# Patient Record
Sex: Male | Born: 2000 | Race: Black or African American | Hispanic: No | Marital: Single | Smoking: Never smoker
Health system: Southern US, Community
[De-identification: ages and names within clinical notes are randomized; demographics above are authoritative.]

## PROBLEM LIST (undated history)

## (undated) DIAGNOSIS — D496 Neoplasm of unspecified behavior of brain: Secondary | ICD-10-CM

## (undated) DIAGNOSIS — G40909 Epilepsy, unspecified, not intractable, without status epilepticus: Secondary | ICD-10-CM

---
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
## 2020-06-18 ENCOUNTER — Ambulatory Visit (INDEPENDENT_AMBULATORY_CARE_PROVIDER_SITE_OTHER): Payer: Medicaid Other | Admitting: Pediatrics

## 2020-08-14 ENCOUNTER — Encounter (INDEPENDENT_AMBULATORY_CARE_PROVIDER_SITE_OTHER): Payer: Self-pay

## 2020-09-05 ENCOUNTER — Emergency Department (HOSPITAL_COMMUNITY)
Admission: EM | Admit: 2020-09-05 | Discharge: 2020-09-05 | Disposition: A | Discharge status: Home / Self Care | Payer: Medicaid Other | Attending: Emergency Medicine | Admitting: Emergency Medicine

## 2020-09-05 ENCOUNTER — Encounter (HOSPITAL_COMMUNITY): Payer: Self-pay | Admitting: *Deleted

## 2020-09-05 ENCOUNTER — Other Ambulatory Visit: Payer: Self-pay

## 2020-09-05 DIAGNOSIS — M6282 Rhabdomyolysis: Secondary | ICD-10-CM | POA: Diagnosis not present

## 2020-09-05 DIAGNOSIS — M79602 Pain in left arm: Secondary | ICD-10-CM | POA: Insufficient documentation

## 2020-09-05 DIAGNOSIS — R748 Abnormal levels of other serum enzymes: Secondary | ICD-10-CM | POA: Diagnosis not present

## 2020-09-05 LAB — CBC WITH DIFFERENTIAL/PLATELET
Abs Immature Granulocytes: 0.01 10*3/uL (ref 0.00–0.07)
Basophils Absolute: 0 10*3/uL (ref 0.0–0.1)
Basophils Relative: 1 %
Eosinophils Absolute: 0.2 10*3/uL (ref 0.0–0.5)
Eosinophils Relative: 3 %
HCT: 49.1 % (ref 39.0–52.0)
Hemoglobin: 16.2 g/dL (ref 13.0–17.0)
Immature Granulocytes: 0 %
Lymphocytes Relative: 36 %
Lymphs Abs: 2.4 10*3/uL (ref 0.7–4.0)
MCH: 28.5 pg (ref 26.0–34.0)
MCHC: 33 g/dL (ref 30.0–36.0)
MCV: 86.4 fL (ref 80.0–100.0)
Monocytes Absolute: 0.4 10*3/uL (ref 0.1–1.0)
Monocytes Relative: 7 %
Neutro Abs: 3.7 10*3/uL (ref 1.7–7.7)
Neutrophils Relative %: 53 %
Platelets: 290 10*3/uL (ref 150–400)
RBC: 5.68 MIL/uL (ref 4.22–5.81)
RDW: 12.2 % (ref 11.5–15.5)
WBC: 6.8 10*3/uL (ref 4.0–10.5)
nRBC: 0 % (ref 0.0–0.2)

## 2020-09-05 LAB — BASIC METABOLIC PANEL
Anion gap: 9 (ref 5–15)
Anion gap: 7 (ref 5–15)
BUN: 9 mg/dL (ref 6–20)
BUN: 10 mg/dL (ref 6–20)
CO2: 25 mmol/L (ref 22–32)
CO2: 26 mmol/L (ref 22–32)
Calcium: 9.3 mg/dL (ref 8.9–10.3)
Calcium: 8.8 mg/dL — ABNORMAL LOW (ref 8.9–10.3)
Chloride: 102 mmol/L (ref 98–111)
Chloride: 103 mmol/L (ref 98–111)
Creatinine, Ser: 0.98 mg/dL (ref 0.61–1.24)
Creatinine, Ser: 0.94 mg/dL (ref 0.61–1.24)
GFR, Estimated: >60 mL/min (ref >60)
GFR, Estimated: >60 mL/min (ref >60)
Glucose, Bld: 88 mg/dL (ref 70–99)
Glucose, Bld: 79 mg/dL (ref 70–99)
Potassium: 3.7 mmol/L (ref 3.5–5.1)
Potassium: 3.6 mmol/L (ref 3.5–5.1)
Sodium: 136 mmol/L (ref 135–145)
Sodium: 136 mmol/L (ref 135–145)

## 2020-09-05 LAB — URINALYSIS, ROUTINE W REFLEX MICROSCOPIC
Bilirubin Urine: NEGATIVE
Glucose, UA: NEGATIVE mg/dL
Hgb urine dipstick: NEGATIVE
Ketones, ur: NEGATIVE mg/dL
Leukocytes,Ua: NEGATIVE
Nitrite: NEGATIVE
Protein, ur: NEGATIVE mg/dL
Specific Gravity, Urine: 1.023 (ref 1.005–1.030)
pH: 5 (ref 5.0–8.0)

## 2020-09-05 LAB — CK
Total CK: 5357 U/L — ABNORMAL HIGH (ref 49–397)
Total CK: 3887 U/L — ABNORMAL HIGH (ref 49–397)

## 2020-09-05 MED ORDER — LACTATED RINGERS IV BOLUS
1000.0000 mL | Freq: Once | INTRAVENOUS | Status: AC
  Administered 2020-09-05: 1000 mL via INTRAVENOUS

## 2020-09-05 NOTE — ED Provider Notes (Signed)
Emergency Medicine Provider Triage Evaluation Note  Dale [facility] , a 20 y.o. male  was evaluated in triage.  Pt complains of lateral arm pain, left greater than right.  States he was working out just his biceps yesterday.  He feels like he "overdid it."  He states has been trying to build his upper body for quite some time and typically lifts weights however today feels different.  He has diffuse aching to his bilateral biceps.  No overlying redness, swelling or warmth.  No paresthesias or weakness.  He denies any recent traumatic injury.  States he has not urinated today.    Review of Systems  Positive: Bilateral arm pain, Anuria  Negative: Paresthesias, weakness  Physical Exam  There were no vitals taken for this visit. Gen:   Awake, no distress   Resp:  Normal effort  MSK:   Moves extremities without difficulty.  Diffuse tenderness to bilateral biceps.  Full range of motion at olecranon, wrist and shoulders.  Equal grip bilaterally Neuro:  Intact sensation  Other:    Medical Decision Making  Medically screening exam initiated at 4:07 PM.  Appropriate orders placed.  [name] was informed that the remainder of the evaluation will be completed by another provider, this initial triage assessment does not replace that evaluation, and the importance of remaining in the ED until their evaluation is complete.  Bilateral arm pain after working out.  He is not urinated today.  Concern for rhabdomyolysis.  Basic labs, CK level obtained.   No obvious VTE on exam.  Stable   [name], PA-C 09/05/20 1611    Breck Coons, MD 09/05/20 [phone]

## 2020-09-05 NOTE — ED Triage Notes (Signed)
The pt has been working out for 2 weeks working out each day in different part of his body.  Painful lt upper arm since yesterday

## 2020-09-05 NOTE — ED Provider Notes (Signed)
[facility]GENC[facility] DEPARTMENT Provider Note   CSN: [id number] Arrival date & time: 09/05/20  1556     History Chief Complaint  Patient presents with  . Arm Pain    Sears Bumgarner is a 20 y.o. male.   Arm Pain This is a new problem. The current episode started yesterday. The problem occurs constantly. The problem has not changed since onset.Pertinent negatives include no chest pain, no abdominal pain, no headaches and no shortness of breath. Nothing aggravates the symptoms. Nothing relieves the symptoms. He has tried nothing for the symptoms. The treatment provided no relief.       Past Medical History:  Diagnosis Date  . Brain tumor ([place])   . Seizure disorder [facility])     Patient Active Problem List   Diagnosis Date Noted  . Ganglioglioma of brain ([place]) 12/26/2019  . Quadrantanopsia, left 12/19/2019  . History of brain tumor 08/02/2019  . Primary brain tumor ([place]) 06/01/2018  . Focal epilepsy with impairment of consciousness ([place]) 06/01/2018  . Transient alteration of awareness 03/14/2017  . Abnormal involuntary movements 03/14/2017    Past Surgical History:  Procedure Laterality Date  . CRANIOTOMY FOR TUMOR Left 06/18/2019   Grade 1 ganglioglioma       No family history on file.  Social History   Tobacco Use  . Smoking status: Never Smoker  . Smokeless tobacco: Never Used  Substance Use Topics  . Alcohol use: Never  . Drug use: Never    Home Medications Prior to Admission medications   Medication Sig Start Date End Date Taking? Authorizing Provider  levETIRAcetam (KEPPRA) 500 MG tablet Take 1 1/2 tablets PO  BID 12/19/19   Jodi Geralds, MD    Allergies    Patient has no known allergies.  Review of Systems   Review of Systems  Constitutional: Negative for chills and fever.  HENT: Negative for congestion and rhinorrhea.   Respiratory: Negative for cough and shortness of breath.   Cardiovascular: Negative for chest pain and  palpitations.  Gastrointestinal: Negative for abdominal pain, diarrhea, nausea and vomiting.  Genitourinary: Negative for difficulty urinating, dysuria and hematuria.  Musculoskeletal: Positive for myalgias. Negative for arthralgias and back pain.  Skin: Negative for color change and rash.  Neurological: Negative for light-headedness and headaches.    Physical Exam Updated Vital Signs BP 120/88   Pulse 60   Temp 98.6 F (37 C) (Oral)   Resp 14   Ht 5\' 11"  (1.803 m)   Wt 68 kg   SpO2 99%   BMI 20.92 kg/m   Physical Exam Vitals and nursing note reviewed.  Constitutional:      General: He is not in acute distress.    Appearance: Normal appearance.  HENT:     Head: Normocephalic and atraumatic.     Nose: No rhinorrhea.  Eyes:     General:        Right eye: No discharge.        Left eye: No discharge.     Conjunctiva/sclera: Conjunctivae normal.  Cardiovascular:     Rate and Rhythm: Normal rate and regular rhythm.  Pulmonary:     Effort: Pulmonary effort is normal.     Breath sounds: No stridor.  Abdominal:     General: Abdomen is flat. There is no distension.     Palpations: Abdomen is soft.  Musculoskeletal:        General: Tenderness present. No deformity or signs of injury.  Comments: Tenderness of bilateral biceps belly muscles.  Normal range of motion neurovascular intact no bony tenderness.  No color change no deformity  Skin:    General: Skin is warm and dry.  Neurological:     General: No focal deficit present.     Mental Status: He is alert. Mental status is at baseline.     Motor: No weakness.  Psychiatric:        Mood and Affect: Mood normal.        Behavior: Behavior normal.        Thought Content: Thought content normal.     ED Results / Procedures / Treatments   Labs (all labs ordered are listed, but only abnormal results are displayed) Labs Reviewed  CK - Abnormal; Notable for the following components:      Result Value   Total CK 5,357  (*)    All other components within normal limits  CK - Abnormal; Notable for the following components:   Total CK 3,887 (*)    All other components within normal limits  BASIC METABOLIC PANEL - Abnormal; Notable for the following components:   Calcium 8.8 (*)    All other components within normal limits  CBC WITH DIFFERENTIAL/PLATELET  BASIC METABOLIC PANEL  URINALYSIS, ROUTINE W REFLEX MICROSCOPIC    EKG None  Radiology No results found.  Procedures Procedures   Medications Ordered in ED Medications  lactated ringers bolus 1,000 mL (0 mLs Intravenous Stopped 09/05/20 2126)  lactated ringers bolus 1,000 mL (0 mLs Intravenous Stopped 09/05/20 2126)  lactated ringers bolus 1,000 mL (1,000 mLs Intravenous New Bag/Given 09/05/20 2126)    ED Course  I have reviewed the triage vital signs and the nursing notes.  Pertinent labs & imaging results that were available during my care of the patient were reviewed by me and considered in my medical decision making (see chart for details).    MDM Rules/Calculators/A&P                         Elevated CK, likely rhabdomyolysis from overuse at the gym.  Overall well-appearing normal kidney function after review of labs.  Will give significant fluid hydration we will recheck labs.  Patient is given 2 L of fluids and labs are rechecked.  CK is downtrending.  Repeat chemistry is unchanged.  Patient likely has a rhabdomyolysis from overexertion.  He is instructed to hydrate well follow-up for repeat testing.  Pt is safe for DC home with outpatient follow up. The patient agrees with the plan and has no other questions or concerns.   Final Clinical Impression(s) / ED Diagnoses Final diagnoses:  Elevated CK  Non-traumatic rhabdomyolysis    Rx / DC Orders ED Discharge Orders    None       Breck Coons, MD 09/05/20 2236

## 2020-09-05 NOTE — Discharge Instructions (Addendum)
Continue to hydrate well.  When she drinking least 100 ounces of fluids per day over the next 2 days.  Follow-up with your primary care doctor if symptoms or not improving or come back and see [place].  See your primary care doctor early next week for repeat laboratory testing.  Come back to [place] with changes in your ability to urinate or the pain.

## 2020-09-28 ENCOUNTER — Emergency Department (HOSPITAL_COMMUNITY)
Admission: EM | Admit: 2020-09-28 | Discharge: 2020-09-28 | Disposition: A | Discharge status: Home / Self Care | Payer: Medicaid Other | Attending: Emergency Medicine | Admitting: Emergency Medicine

## 2020-09-28 ENCOUNTER — Encounter (HOSPITAL_COMMUNITY): Payer: Self-pay | Admitting: Emergency Medicine

## 2020-09-28 ENCOUNTER — Other Ambulatory Visit: Payer: Self-pay

## 2020-09-28 DIAGNOSIS — M533 Sacrococcygeal disorders, not elsewhere classified: Secondary | ICD-10-CM | POA: Diagnosis present

## 2020-09-28 DIAGNOSIS — M7918 Myalgia, other site: Secondary | ICD-10-CM

## 2020-09-28 MED ORDER — AMOXICILLIN-POT CLAVULANATE 875-125 MG PO TABS: 1.0000 | ORAL_TABLET | Freq: Two times a day (BID) | ORAL | 0 refills | Status: AC

## 2020-09-28 NOTE — Discharge Instructions (Addendum)
-  Prescription sent to the pharmacy for Augmentin.  This is an antibiotic.  Take as prescribed.   Follow-up with your primary care doctor.  Their contact information is listed in your discharge paperwork.  Call the office to schedule an appointment.   You can take Tylenol and ibuprofen for pain.  You can also try applying a warm compress to the area to see if that helps.   Return to ER for new or worsening symptoms.

## 2020-09-28 NOTE — ED Triage Notes (Signed)
Pt here for tailbone pain  x4 days, pt denies injury/fall to the area. Pt reports feeling a small bump towards L buttocks.

## 2020-09-28 NOTE — ED Provider Notes (Signed)
Emergency Medicine Provider Triage Evaluation Note  D[facility] , a 20 y.o. male  was evaluated in triage.  Pt complains of tailbone pain x 4 days. Denies injury. Feels a lump when he touches the area.  Review of Systems  Positive: Back pain Negative: Fever, chills, numbness, tingling, extremity weakness  Physical Exam  BP 132/84 (BP Location: Right Arm)   Pulse 61   Temp 98.7 F (37.1 C) (Oral)   Resp 16   SpO2 99%  Gen:   Awake, no distress   Resp:  Normal effort  MSK:   Moves extremities without difficulty  Other:  Ambulatory with normal gait  Medical Decision Making  Medically screening exam initiated at 7:54 AM.  Appropriate orders placed.  [facility] was informed that the remainder of the evaluation will be completed by another provider, this initial triage assessment does not replace that evaluation, and the importance of remaining in the ED until their evaluation is complete.  ?possible abscess vs pilonidal cyst. Will need full exam once in treatment room.    Portions of this note were generated with Lobbyist. Dictation errors may occur despite best attempts at proofreading.    Barrie Folk, PA-C 09/28/20 1314    Noemi Chapel, MD 10/01/20 1038

## 2020-09-28 NOTE — ED Provider Notes (Signed)
[place] EMERGENCY DEPARTMENT Provider Note   CSN: [id number] Arrival date & time: 09/28/20  3419     History Chief Complaint  Patient presents with   Tailbone Pain    Dale Hart is a 20 y.o. male with past medical history significant for ganglioma of brain, seizure disorder.  HPI Patient presents to emergency room today with chief complaint of tailbone pain x4 days.  He denies any known injury.  He states he typically is an active person playing lots of sports however has not played sports in over 3 weeks.  He is endorsing dull aching pain located at the base of his tailbone.  He states the pain is constant.  He rates the pain 4 out of 10 in severity.  Patient states he feels a small bump between his buttocks.  He has not taken any over-the-counter medications for symptoms prior to arrival.  He denies any fever, chills, abdominal pain, pain with defecation, extremity weakness, weight loss, night sweats.    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 No pertinent family history.  Social History   Tobacco Use   Smoking status: Never   Smokeless tobacco: Never  Substance Use Topics   Alcohol use: Never   Drug use: Never    Home Medications Prior to Admission medications   Medication Sig Start Date End Date Taking? Authorizing Provider  amoxicillin-clavulanate (AUGMENTIN) 875-125 MG tablet Take 1  tablet by mouth every 12 (twelve) hours. 09/28/20  Yes Barrie Folk, PA-C  levETIRAcetam (KEPPRA) 500 MG tablet Take 1 1/2 tablets PO  BID 12/19/19   Jodi Geralds, MD    Allergies    Patient has no known allergies.  Review of Systems   Review of Systems All other systems are reviewed and are negative for acute change except as noted in the HPI.  Physical Exam Updated Vital Signs BP 132/84 (BP Location: Right Arm)   Pulse 61   Temp 98.7 F (37.1 C) (Oral)   Resp 16   Ht 5\' 11"  (1.803 m)   Wt 68 kg   SpO2 99%   BMI 20.92 kg/m   Physical Exam Vitals and nursing note reviewed.  Constitutional:      Appearance: He is well-developed. He is not ill-appearing or toxic-appearing.  HENT:     Head: Normocephalic and atraumatic.     Nose: Nose normal.  Eyes:     General: No scleral icterus.       Right eye: No discharge.        Left eye: No discharge.     Conjunctiva/sclera: Conjunctivae normal.  Neck:     Vascular: No JVD.  Cardiovascular:     Rate and Rhythm: Normal rate and regular rhythm.     Pulses: Normal pulses.     Heart sounds: Normal heart sounds.  Pulmonary:     Effort: Pulmonary effort is normal.  Breath sounds: Normal breath sounds.  Abdominal:     General: There is no distension.  Musculoskeletal:        General: Normal range of motion.     Cervical back: Normal range of motion.  Skin:    General: Skin is warm and dry.     Comments: Lysbeth Galas RN present for exam. No abscess appreciated in gluteal cleft. With deep palpation of left gluteal cleft there is approximately 1x1 cm area of mobile induration. No overlying skin changes.  Neurological:     Mental Status: He is oriented to person, place, and time.     GCS: GCS eye subscore is 4. GCS verbal subscore is 5. GCS motor subscore is 6.     Comments: Fluent speech, no facial droop.  Psychiatric:        Behavior: Behavior normal.    ED Results / Procedures / Treatments   Labs (all labs  ordered are listed, but only abnormal results are displayed) Labs Reviewed - No data to display  EKG None  Radiology No results found.  Procedures Procedures   Medications Ordered in ED Medications - No data to display  ED Course  I have reviewed the triage vital signs and the nursing notes.  Pertinent labs & imaging results that were available during my care of the patient were reviewed by me and considered in my medical decision making (see chart for details).    MDM Rules/Calculators/A&P                          History provided by patient with additional history obtained from chart review.    Patient is well-appearing, nontoxic.  Patient with likely early pilonidal cyst.  No overlying skin changes, no signs of infection.  Not amendable to I&D at this time based on how deep it is.  Will cover with antibiotics and have patient follow-up with PCP for recheck.  Strict return precautions discussed.    Portions of this note were generated with [employer]. Dictation errors may occur despite best attempts at proofreading.  Final Clinical Impression(s) / ED Diagnoses Final diagnoses:  Buttock pain    Rx / DC Orders ED Discharge Orders          Ordered    amoxicillin-clavulanate (AUGMENTIN) 875-125 MG tablet  Every 12 hours        09/28/20 0843             Barrie Folk, PA-C 09/28/20 [id number]    Noemi Chapel, MD 10/01/20 1038

## 2020-11-07 ENCOUNTER — Other Ambulatory Visit: Payer: Self-pay

## 2020-11-07 ENCOUNTER — Emergency Department (HOSPITAL_COMMUNITY)
Admission: EM | Admit: 2020-11-07 | Discharge: 2020-11-08 | Disposition: A | Discharge status: Home / Self Care | Payer: Medicaid Other | Attending: Emergency Medicine | Admitting: Emergency Medicine

## 2020-11-07 DIAGNOSIS — S80912A Unspecified superficial injury of left knee, initial encounter: Secondary | ICD-10-CM | POA: Diagnosis not present

## 2020-11-07 DIAGNOSIS — Y92310 Basketball court as the place of occurrence of the external cause: Secondary | ICD-10-CM | POA: Insufficient documentation

## 2020-11-07 DIAGNOSIS — Y9367 Activity, basketball: Secondary | ICD-10-CM | POA: Diagnosis not present

## 2020-11-07 DIAGNOSIS — X58XXXA Exposure to other specified factors, initial encounter: Secondary | ICD-10-CM | POA: Insufficient documentation

## 2020-11-07 DIAGNOSIS — M25562 Pain in left knee: Secondary | ICD-10-CM

## 2020-11-07 NOTE — ED Provider Notes (Signed)
Emergency Medicine Provider Triage Evaluation Note  Dale Hart , a 20 y.o. male  was evaluated in triage.  Pt complains of left knee pain after playing bball today. Hyperextended his knee.  Pain is worsened with movement.  He states that it feels slightly unstable.  Denies any treatment prior to arrival..  Review of Systems  Positive: Arthralgia, myalgia Negative: Fever, chills  Physical Exam  BP 120/72 (BP Location: Left Arm)   Pulse 81   Temp 99.2 F (37.3 C) (Oral)   Resp 18   Ht '5\' 11"'$  (1.803 m)   Wt 68 kg   SpO2 100%   BMI 20.92 kg/m  Gen:   Awake, no distress  Resp:  Normal effort MSK:   Left knee range of motion limited secondary to pain Other:    Medical Decision Making  Medically screening exam initiated at 10:50 PM.  Appropriate orders placed.  Holdan Sandin was informed that the remainder of the evaluation will be completed by another provider, this initial triage assessment does not replace that evaluation, and the importance of remaining in the ED until their evaluation is complete.  X-ray of left knee Suspect knee sprain, concern for internal derangement, will give immobilizer and crutches.  Will need Ortho follow-up.  Left knee pain.   Montine Circle, PA-C 11/07/20 2252    Malvin Johns, MD 11/07/20 [phone]

## 2020-11-07 NOTE — ED Triage Notes (Signed)
Pt states sustained left knee injury today while playing basketball. Ambulatory in triage.

## 2020-11-08 ENCOUNTER — Emergency Department (HOSPITAL_COMMUNITY): Payer: Medicaid Other

## 2020-11-08 MED ORDER — IBUPROFEN 800 MG PO TABS: 800.0000 mg | ORAL_TABLET | Freq: Three times a day (TID) | ORAL | 0 refills | Status: AC

## 2020-11-08 NOTE — ED Provider Notes (Signed)
[place] EMERGENCY DEPARTMENT Provider Note   CSN: [id number] Arrival date & time: 11/07/20  2234     History Chief Complaint  Patient presents with   Knee Injury    Dale Hart is a 20 y.o. male.  Patient presents emergency department with a chief complaint of left knee pain.  He states he injured his left knee while playing basketball today.  It hyperextended.  He reports feeling unstable.  Has worsening pain with flexion.  Has not taken anything for the symptoms.  He is able to ambulate on it, though it is painful.  The history is provided by the patient. No language interpreter was used.      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  Social History   Tobacco Use   Smoking status: Never   Smokeless tobacco: Never  Substance Use Topics   Alcohol use: Never   Drug use: Never    Home Medications Prior to Admission medications   Medication Sig Start Date End Date Taking? Authorizing Provider  ibuprofen (ADVIL) 800 MG tablet Take 1 tablet (800 mg total) by mouth 3 (three) times daily. 11/08/20  Yes Montine Circle, PA-C  amoxicillin-clavulanate (AUGMENTIN) 875-125 MG tablet Take 1 tablet by mouth every 12 (twelve) hours. 09/28/20   Barrie Folk, PA-C  levETIRAcetam (KEPPRA) 500 MG tablet Take 1 1/2 tablets PO  BID 12/19/19   Jodi Geralds, MD    Allergies    Patient has  no known allergies.  Review of Systems   Review of Systems  All other systems reviewed and are negative.  Physical Exam Updated Vital Signs BP 120/72 (BP Location: Left Arm)   Pulse 81   Temp 99.2 F (37.3 C) (Oral)   Resp 18   Ht '5\' 11"'$  (1.803 m)   Wt 68 kg   SpO2 100%   BMI 20.92 kg/m   Physical Exam Nursing note and vitals reviewed.  Constitutional: Pt appears well-developed and well-nourished. No distress.  HENT:  Head: Normocephalic and atraumatic.  Eyes: Conjunctivae are normal.  Neck: Normal range of motion.  Cardiovascular: Normal rate, regular rhythm. Intact distal pulses.   Capillary refill < 3 sec.  Pulmonary/Chest: Effort normal and breath sounds normal.  Musculoskeletal:   Pt exhibits no bony abnormality or deformity about the left knee.   Range of motion and strength slightly limited secondary to pain, joint stability testing deferred due to pain  Neurological: Pt  is alert. Coordination normal.  Sensation: 5/5 Skin: Skin is warm and dry. Pt is not diaphoretic.  No evidence of open wound or skin tenting Psychiatric: Pt has a normal mood and affect.   ED Results / Procedures / Treatments   Labs (all labs ordered are listed, but only abnormal results are displayed) Labs Reviewed - No data to display  EKG None  Radiology DG Knee Complete 4 Views  Left  Result Date: 11/08/2020 CLINICAL DATA:  Left knee pain EXAM: LEFT KNEE - COMPLETE 4+ VIEW COMPARISON:  None. FINDINGS: No evidence of fracture, dislocation, or joint effusion. No evidence of arthropathy or other focal bone abnormality. Soft tissues are unremarkable. IMPRESSION: Negative. Electronically Signed   By: Fidela Salisbury MD   On: 11/08/2020 00:27    Procedures Procedures   Medications Ordered in ED Medications - No data to display  ED Course  I have reviewed the triage vital signs and the nursing notes.  Pertinent labs & imaging results that were available during my care of the patient  were reviewed by me and considered in my medical decision making (see chart for details).    MDM Rules/Calculators/A&P                          Patient presents with injury to left knee.  DDx includes, fracture, strain, or sprain.  Consultants: none  Plain films reveal no fracture or dislocation.  Pt advised to follow up with PCP and/or orthopedics. Patient given crutches and knee immobilizer while in ED, conservative therapy such as RICE recommended and discussed.   Patient will be discharged home & is agreeable with above plan. Returns precautions discussed. Pt appears safe for discharge.  Final Clinical Impression(s) / ED Diagnoses Final diagnoses:  Acute pain of left knee    Rx / DC Orders ED Discharge Orders          Ordered    ibuprofen (ADVIL) 800 MG tablet  3 times daily        11/08/20 0219             Montine Circle, PA-C 11/08/20 [place] Charon, April, MD 11/08/20 0230

## 2021-09-22 IMAGING — CR DG KNEE COMPLETE 4+V*L*
4 series · 4 of 4 positions shown · non-contrast
Comparison: None.

CLINICAL DATA: Left knee pain

EXAM:
LEFT KNEE - COMPLETE 4+ VIEW

[knee ap]
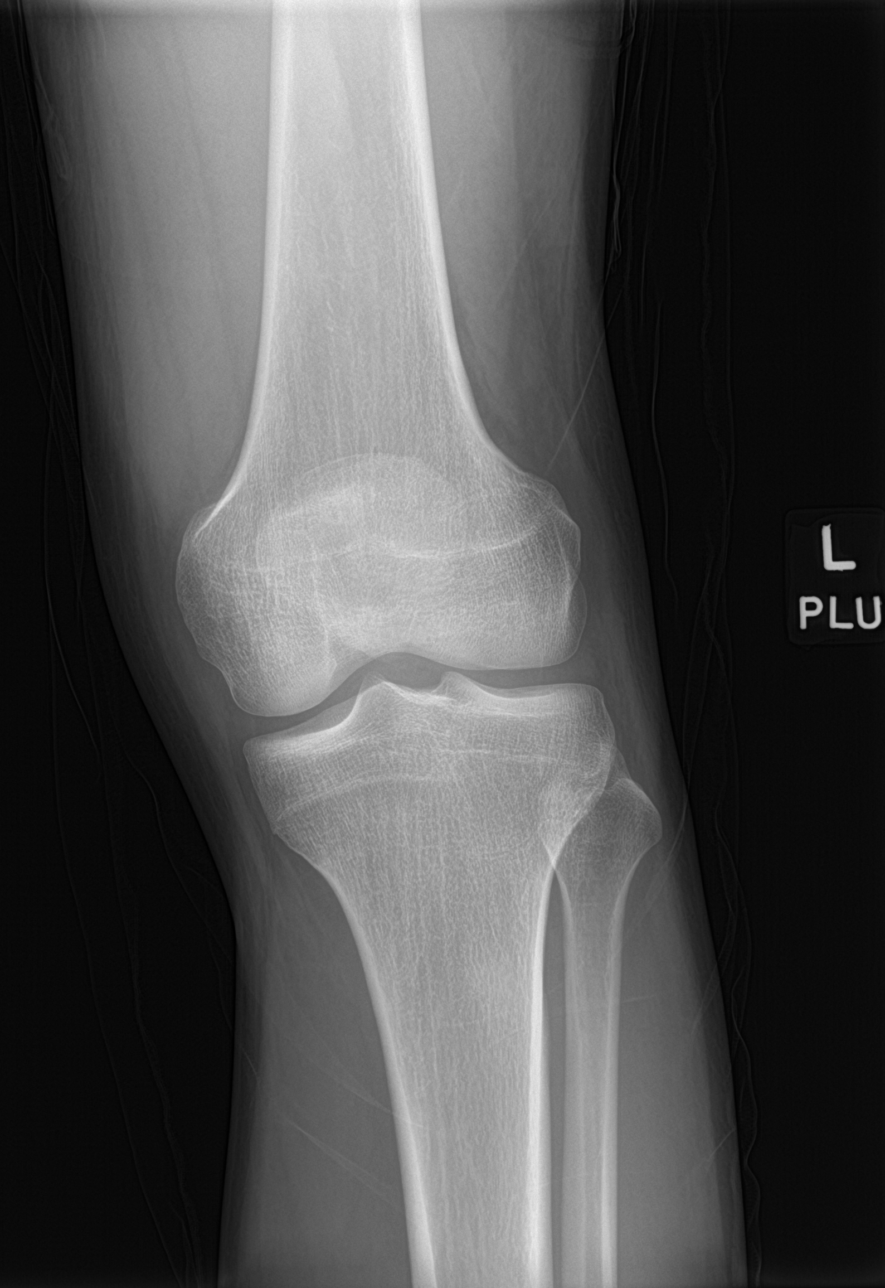

[knee lat]
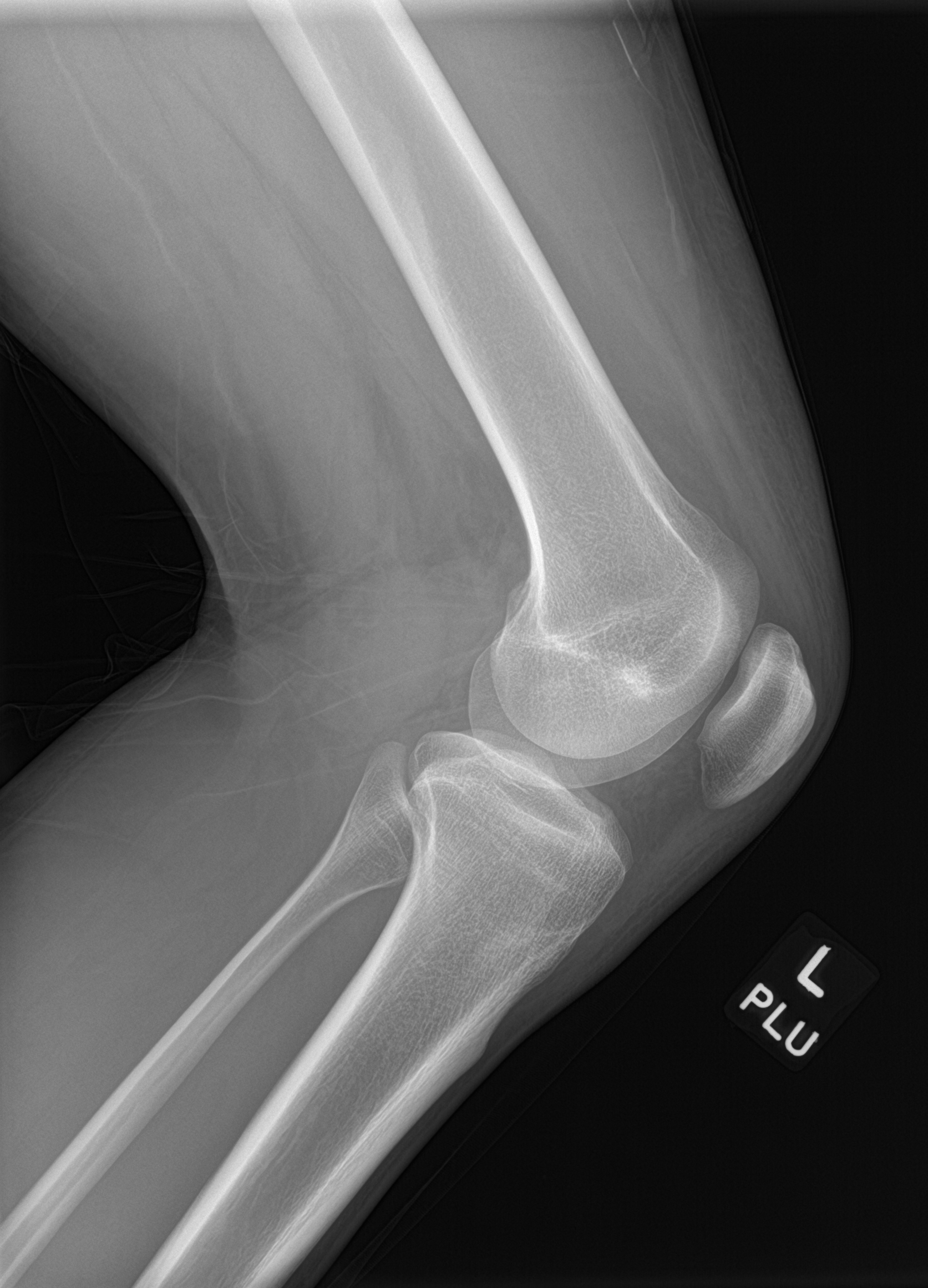

[knee obl (1 of 2)]
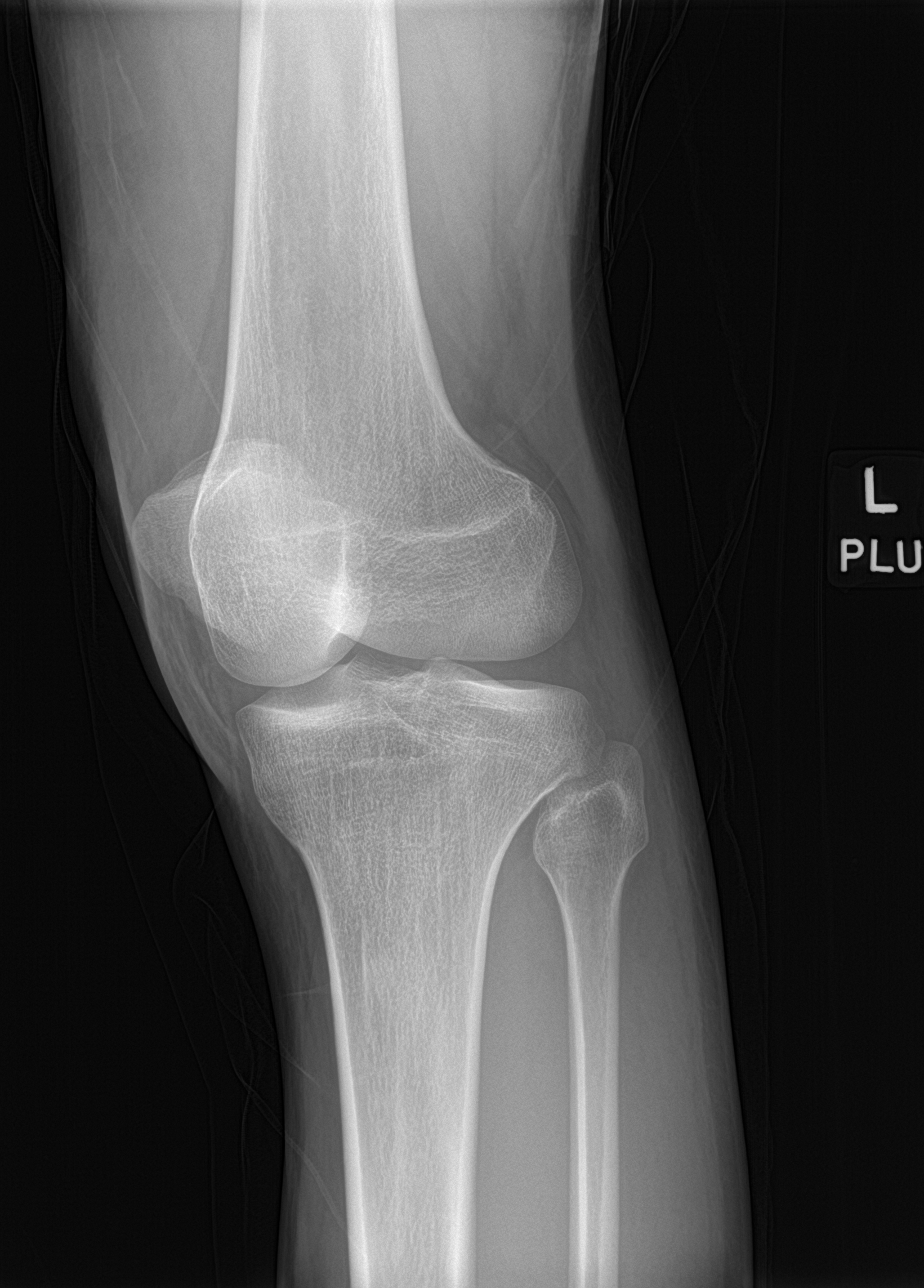

[knee obl (2 of 2)]
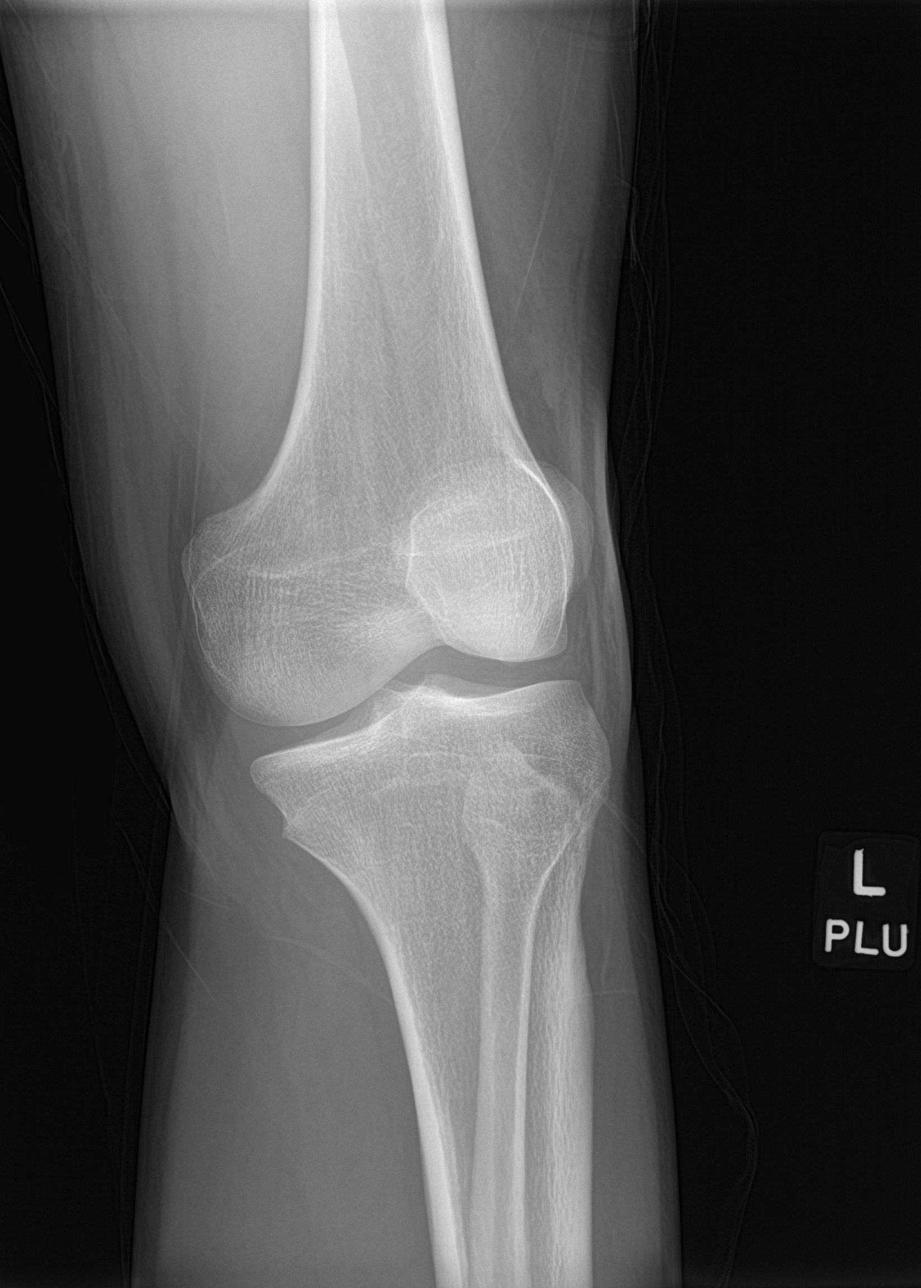

[4 of 4 positions shown; findings below may reference images not displayed]

FINDINGS: No evidence of fracture, dislocation, or joint effusion. No evidence
of arthropathy or other focal bone abnormality. Soft tissues are
unremarkable.
IMPRESSION: Negative.

## 2023-04-14 ENCOUNTER — Encounter (HOSPITAL_COMMUNITY): Payer: Self-pay | Admitting: Emergency Medicine

## 2023-04-14 ENCOUNTER — Emergency Department (HOSPITAL_COMMUNITY)
Admission: EM | Admit: 2023-04-14 | Discharge: 2023-04-14 | Disposition: A | Discharge status: Home / Self Care | Payer: Medicaid Other | Attending: Emergency Medicine | Admitting: Emergency Medicine

## 2023-04-14 ENCOUNTER — Other Ambulatory Visit: Payer: Self-pay

## 2023-04-14 DIAGNOSIS — Z20822 Contact with and (suspected) exposure to covid-19: Secondary | ICD-10-CM | POA: Diagnosis not present

## 2023-04-14 DIAGNOSIS — H1033 Unspecified acute conjunctivitis, bilateral: Secondary | ICD-10-CM | POA: Diagnosis not present

## 2023-04-14 DIAGNOSIS — J029 Acute pharyngitis, unspecified: Secondary | ICD-10-CM

## 2023-04-14 DIAGNOSIS — J069 Acute upper respiratory infection, unspecified: Secondary | ICD-10-CM | POA: Insufficient documentation

## 2023-04-14 LAB — RESP PANEL BY RT-PCR (RSV, FLU A&B, COVID)  RVPGX2
Influenza A by PCR: NEGATIVE
Influenza B by PCR: NEGATIVE
Resp Syncytial Virus by PCR: NEGATIVE
SARS Coronavirus 2 by RT PCR: NEGATIVE

## 2023-04-14 LAB — GROUP A STREP BY PCR: Group A Strep by PCR: NOT DETECTED

## 2023-04-14 MED ORDER — POLYMYXIN B-TRIMETHOPRIM 10000-0.1 UNIT/ML-% OP SOLN: 1.0000 [drp] | Freq: Four times a day (QID) | OPHTHALMIC | 0 refills | Status: AC

## 2023-04-14 MED ORDER — LIDOCAINE VISCOUS HCL 2 % MT SOLN: 15.0000 mL | OROMUCOSAL | 0 refills | Status: AC | PRN

## 2023-04-14 NOTE — ED Triage Notes (Signed)
 Pt from home, reports sore throat, headache and fatigue X4 days.  OTC medication taken once, no relief.

## 2023-04-14 NOTE — Discharge Instructions (Addendum)
 You appear to have an upper respiratory infection (URI). An upper respiratory tract infection, or cold, is a viral infection of the air passages leading to the lungs. It should improve gradually after 5-7 days. You may have a lingering cough that lasts for 2- 4 weeks after the infection.  Your flu, covid, and RSV test were negative today.  Your strep throat is negative.  There are no medications, such as antibiotics, that will cure your infection.   You may have a bacterial infection in your eyes.  You have been prescribed Polytrim  antibiotic drops for your eyes.  Please use these as prescribed.  You have been prescribed viscous lidocaine  to use as needed for your sore throat.  Please take this as prescribed.  Home care instructions:  You can take Tylenol  and/or Ibuprofen  as directed on the packaging for fever reduction and pain relief.    For cough: honey 1/2 to 1 teaspoon (you can dilute the honey in water or another fluid).  You can also use guaifenesin and dextromethorphan for cough which are over-the-counter medications. You can use a humidifier for chest congestion and cough.  If you don't have a humidifier, you can sit in the bathroom with the hot shower running.      For sore throat: try warm salt water gargles, cepacol lozenges, throat spray, warm tea or water with lemon/honey, popsicles or ice, or OTC cold relief medicine for throat discomfort.    For congestion: Flonase 1-2 sprays in each nostril daily.    It is important to stay hydrated: drink plenty of fluids (water, gatorade/powerade/pedialyte, juices, or teas) to keep your throat moisturized and help further relieve irritation/discomfort.   Your illness is contagious and can be spread to others, especially during the first 3 or 4 days. It cannot be cured by antibiotics or other medicines. Take basic precautions such as washing your hands often, covering your mouth when you cough or sneeze, and avoiding public places where you  could spread your illness to others.   Follow-up instructions: Please follow-up with your primary care provider for further evaluation of your symptoms if you are not feeling better within the next 5 days.   Return instructions:  Please return to the Emergency Department if you experience worsening symptoms.  RETURN IMMEDIATELY IF you develop shortness of breath, confusion or altered mental status, a new rash, become dizzy, faint, or poorly responsive, or are unable to be cared for at home. Please return if you have persistent vomiting and cannot keep down fluids or develop a fever that is not controlled by tylenol  or motrin .   Please return if you have any other emergent concerns.

## 2023-04-14 NOTE — ED Provider Notes (Signed)
 [place] [facility]GENCY DEPARTMENT AT [facility] Provider Note   CSN: [id number] Arrival date & time: 04/14/23  0405     History  Chief Complaint  Patient presents with   Sore Throat   Fatigue    Dale Hart is a 23 y.o. male with no significant past medical history presents with concern for sore throat, headache, and general fatigue for the past 4 days.  Also reports subjective fever and chills and nasal congestion.  Denies cough, nausea, vomiting, abdominal pain.  Tried NyQuil once at home which he reports helped and sleep, but did not help his other symptoms.   Sore Throat Pertinent negatives include no shortness of breath.       Home Medications Prior to Admission medications   Medication Sig Start Date End Date Taking? Authorizing Provider  lidocaine  (XYLOCAINE ) 2 % solution Use as directed 15 mLs in the mouth or throat every 4 (four) hours as needed for mouth pain. 04/14/23  Yes Veta Palma, PA-C  trimethoprim -polymyxin b  (POLYTRIM ) ophthalmic solution Place 1 drop into both eyes every 6 (six) hours for 7 days. 04/14/23 04/21/23 Yes Veta Palma, PA-C  amoxicillin -clavulanate (AUGMENTIN ) 875-125 MG tablet Take 1 tablet by mouth every 12 (twelve) hours. 09/28/20   Walisiewicz, Kaitlyn E, PA-C  ibuprofen  (ADVIL ) 800 MG tablet Take 1 tablet (800 mg total) by mouth 3 (three) times daily. 11/08/20   Vicky Charleston, PA-C  levETIRAcetam  (KEPPRA ) 500 MG tablet Take 1 1/2 tablets PO  BID 12/19/19   Susen Elsie DEL, MD      Allergies    Patient has no known allergies.    Review of Systems   Review of Systems  Constitutional:  Positive for chills and fever.  Respiratory:  Negative for cough and shortness of breath.     Physical Exam Updated Vital Signs BP 135/84 (BP Location: Right Arm)   Pulse 78   Temp 98.7 F (37.1 C)   Resp 16   Ht 5' 11 (1.803 m)   Wt 70.8 kg   SpO2 100%   BMI 21.76 kg/m  Physical Exam Vitals and nursing note reviewed.   Constitutional:      General: He is not in acute distress.    Appearance: He is well-developed.  HENT:     Head: Normocephalic and atraumatic.     Mouth/Throat:     Comments: Posterior oropharynx with mild erythema, no tonsillar exudate.  No tonsillar abscess  Patient tolerating secretions well, swallows without difficulty Eyes:     Extraocular Movements: Extraocular movements intact.     Pupils: Pupils are equal, round, and reactive to light.     Comments: Erythema of the conjunctiva bilaterally.  No mucoid/purulent discharge or crusting  Cardiovascular:     Rate and Rhythm: Normal rate and regular rhythm.     Heart sounds: No murmur heard. Pulmonary:     Effort: Pulmonary effort is normal. No respiratory distress.     Breath sounds: Normal breath sounds.  Abdominal:     Palpations: Abdomen is soft.     Tenderness: There is no abdominal tenderness.  Musculoskeletal:        General: No swelling.     Cervical back: Neck supple.  Lymphadenopathy:     Cervical: Cervical adenopathy present.  Skin:    General: Skin is warm and dry.     Capillary Refill: Capillary refill takes less than 2 seconds.  Neurological:     Mental Status: He is alert.  Psychiatric:  Mood and Affect: Mood normal.     ED Results / Procedures / Treatments   Labs (all labs ordered are listed, but only abnormal results are displayed) Labs Reviewed  GROUP A STREP BY PCR  RESP PANEL BY RT-PCR (RSV, FLU A&B, COVID)  [id number]    EKG None  Radiology No results found.  Procedures Procedures    Medications Ordered in ED Medications - No data to display  ED Course/ Medical Decision Making/ A&P                                 Medical Decision Making Risk Prescription drug management.     Differential diagnosis includes but is not limited to COVID, flu, RSV, viral URI, strep pharyngitis, viral pharyngitis, allergic rhinitis, pneumonia, bronchitis   ED Course:  Patient  well-appearing, stable vital signs.  His COVID, flu, RSV are negative.  Group A strep negative.  Low suspicion for pneumonia at this time given no cough, stable vitals, lungs clear to auscultation.  I suspect viral URI as the cause of his sore throat, congestion, and subjective fever and chills.  He reports the sore throat is his main concern.  Will try viscous lidocaine  at home for symptom management.  He also has erythematous conjunctiva in the eyes bilaterally.  No visual changes, does not wear contact lenses.  Will treat for possible bacterial conjunctivitis.   Patient stable and appropriate for discharge home  Impression: Viral URI Viral pharyngitis Bacterial versus viral conjunctivitis  Disposition:  The patient was discharged home with instructions to use the prescribed Polytrim  ophthalmic drops and viscous lidocaine  as prescribed.  Over-the-counter medications as needed.  Follow-up with PCP if not improved within the next 5 days. Return precautions given.              Final Clinical Impression(s) / ED Diagnoses Final diagnoses:  Viral URI  Acute bacterial conjunctivitis of both eyes  Viral pharyngitis    Rx / DC Orders ED Discharge Orders          Ordered    lidocaine  (XYLOCAINE ) 2 % solution  Every 4 hours PRN        04/14/23 0730    trimethoprim -polymyxin b  (POLYTRIM ) ophthalmic solution  Every 6 hours        04/14/23 0730              Veta Palma, PA-C 04/14/23 [id number]    Garrick Charleston, MD 04/15/23 1008

## 2024-02-03 ENCOUNTER — Emergency Department (HOSPITAL_COMMUNITY)
Admission: EM | Admit: 2024-02-03 | Discharge: 2024-02-03 | Disposition: A | Discharge status: Home / Self Care | Attending: Emergency Medicine | Admitting: Emergency Medicine

## 2024-02-03 ENCOUNTER — Encounter (HOSPITAL_COMMUNITY): Payer: Self-pay

## 2024-02-03 DIAGNOSIS — K649 Unspecified hemorrhoids: Secondary | ICD-10-CM | POA: Insufficient documentation

## 2024-02-03 DIAGNOSIS — K6289 Other specified diseases of anus and rectum: Secondary | ICD-10-CM | POA: Diagnosis present

## 2024-02-03 MED ORDER — LIDOCAINE 5 % EX OINT: 1.0000 | TOPICAL_OINTMENT | CUTANEOUS | 0 refills | Status: AC | PRN

## 2024-02-03 MED ORDER — LIDOCAINE-EPINEPHRINE-TETRACAINE (LET) TOPICAL GEL
3.0000 mL | Freq: Once | TOPICAL | Status: AC
  Administered 2024-02-03: 3 mL via TOPICAL
  Filled 2024-02-03: qty 3

## 2024-02-03 NOTE — ED Triage Notes (Signed)
 Pt presents with c/o rectal pain. Pt reports he noticed what he thought was a hemorrhoid while using the bathroom approx 2 days ago. Pt denies any bleeding at this time.

## 2024-02-03 NOTE — Discharge Instructions (Signed)
 Take lidocaine  as directed for hemorrhoids.  See handout for instructions on how to do a sitz bath.  Follow-up with general surgery if no improvement.  Return to the ER for any new or worsening symptoms.

## 2024-02-03 NOTE — ED Provider Notes (Signed)
 [facility] AT [facility] Provider Note   CSN: [id number] Arrival date & time: 02/03/24  1000     Patient presents with: Rectal Pain   Dale Hart is a 23 y.o. male.   HPI 23 year old male with a history of seizure disorder, brain tumor presenting with complaints of what he suspects is hemorrhoids.  Patient noted increasing pain with a bowel movement yesterday and noted that part of his rectum came out.  He states that he noticed bumps along his anus and googled it and thought that these were hemorrhoids.  He is complaining of pain.  No bleeding.  He does state that he spends a long time on the toilet at times straining.  Denies any constipation.    Prior to Admission medications   Medication Sig Start Date End Date Taking? Authorizing Provider  lidocaine  (XYLOCAINE ) 5 % ointment Apply 1 Application topically as needed. Apply to rectum up to 4 times daily for no more than 7 days 02/03/24  Yes Vonn Hadassah LABOR, PA-C  amoxicillin -clavulanate (AUGMENTIN ) 875-125 MG tablet Take 1 tablet by mouth every 12 (twelve) hours. 09/28/20   Walisiewicz, Kaitlyn E, PA-C  ibuprofen  (ADVIL ) 800 MG tablet Take 1 tablet (800 mg total) by mouth 3 (three) times daily. 11/08/20   Vicky Charleston, PA-C  levETIRAcetam  (KEPPRA ) 500 MG tablet Take 1 1/2 tablets PO  BID 12/19/19   Susen Elsie DEL, MD  lidocaine  (XYLOCAINE ) 2 % solution Use as directed 15 mLs in the mouth or throat every 4 (four) hours as needed for mouth pain. 04/14/23   Veta Palma, PA-C    Allergies: Patient has no known allergies.    Review of Systems Ten systems reviewed and are negative for acute change, except as noted in the HPI.   Updated Vital Signs BP 128/77 (BP Location: Left Arm)   Pulse (!) 53   Temp 98.5 F (36.9 C) (Oral)   Resp 16   SpO2 100%   Physical Exam Vitals and nursing note reviewed.  Constitutional:      General: He is not in acute distress.    Appearance: He is  well-developed.  HENT:     Head: Normocephalic and atraumatic.  Eyes:     Conjunctiva/sclera: Conjunctivae normal.  Cardiovascular:     Rate and Rhythm: Normal rate and regular rhythm.     Heart sounds: No murmur heard. Pulmonary:     Effort: Pulmonary effort is normal. No respiratory distress.     Breath sounds: Normal breath sounds.  Abdominal:     Palpations: Abdomen is soft.     Tenderness: There is no abdominal tenderness.  Genitourinary:    Comments: Rectal exam performed with RN at bedside.  2 large hemorrhoids noted, 1 slightly larger than the other.  Tender to palpation.  No obvious thrombosed this. Musculoskeletal:        General: No swelling.     Cervical back: Neck supple.  Skin:    General: Skin is warm and dry.     Capillary Refill: Capillary refill takes less than 2 seconds.  Neurological:     Mental Status: He is alert.  Psychiatric:        Mood and Affect: Mood normal.     (all labs ordered are listed, but only abnormal results are displayed) Labs Reviewed - No data to display  EKG: None  Radiology: No results found.   Procedures   Medications Ordered in the ED  lidocaine -EPINEPHrine-tetracaine (LET) topical gel (3  mLs Topical Given 02/03/24 1252)                                    Medical Decision Making Risk Prescription drug management.   23 year old male present with rectal pain.  Exam consistent with hemorrhoids, no obvious thrombosis.  Applied topical lidocaine , will prescribe lidocaine , encouraged over-the-counter hemorrhoid cream.  Will refer to general surgery if refractory. Stable for discharge      Final diagnoses:  Hemorrhoids, unspecified hemorrhoid type    ED Discharge Orders          Ordered    lidocaine  (XYLOCAINE ) 5 % ointment  As needed        02/03/24 1246               Vonn Hadassah DELENA DEVONNA 02/03/24 1309    Rogelia Jerilynn RAMAN, MD 02/12/24 1733
